# Patient Record
Sex: Male | Born: 1945 | Race: White | Hispanic: No | Marital: Married
Health system: Southern US, Community
[De-identification: ages and names within clinical notes are randomized; demographics above are authoritative.]

---

## 2012-02-15 ENCOUNTER — Emergency Department: Payer: Self-pay | Admitting: Emergency Medicine

## 2012-05-15 ENCOUNTER — Ambulatory Visit: Payer: Self-pay | Admitting: General Practice

## 2012-05-15 LAB — URINALYSIS, COMPLETE
Bacteria: NONE SEEN
Bilirubin,UR: NEGATIVE
Blood: NEGATIVE
Nitrite: NEGATIVE
Ph: 6 (ref 4.5–8.0)
Protein: NEGATIVE
Specific Gravity: 1.019 (ref 1.003–1.030)
WBC UR: <1 /HPF (ref 0–5)

## 2012-05-15 LAB — BASIC METABOLIC PANEL
Anion Gap: 7 (ref 7–16)
Chloride: 105 mmol/L (ref 98–107)
Co2: 28 mmol/L (ref 21–32)
Creatinine: 0.92 mg/dL (ref 0.60–1.30)
EGFR (Non-African Amer.): >60
Osmolality: 281 (ref 275–301)
Potassium: 4 mmol/L (ref 3.5–5.1)

## 2012-05-15 LAB — SEDIMENTATION RATE: Erythrocyte Sed Rate: 1 mm/hr (ref 0–20)

## 2012-05-15 LAB — CBC
Platelet: 159 10*3/uL (ref 150–440)
RDW: 13.3 % (ref 11.5–14.5)
WBC: 5.8 10*3/uL (ref 3.8–10.6)

## 2012-05-15 LAB — MRSA PCR SCREENING

## 2012-05-29 ENCOUNTER — Inpatient Hospital Stay: Payer: Self-pay | Admitting: General Practice

## 2012-05-30 LAB — BASIC METABOLIC PANEL
BUN: 11 mg/dL (ref 7–18)
Calcium, Total: 7.9 mg/dL — ABNORMAL LOW (ref 8.5–10.1)
Creatinine: 0.77 mg/dL (ref 0.60–1.30)
EGFR (African American): >60
EGFR (Non-African Amer.): >60
Glucose: 96 mg/dL (ref 65–99)
Potassium: 3.6 mmol/L (ref 3.5–5.1)
Sodium: 142 mmol/L (ref 136–145)

## 2012-05-31 LAB — BASIC METABOLIC PANEL
Calcium, Total: 7.6 mg/dL — ABNORMAL LOW (ref 8.5–10.1)
Creatinine: 0.69 mg/dL (ref 0.60–1.30)
EGFR (African American): >60
EGFR (Non-African Amer.): >60

## 2012-05-31 LAB — HEMOGLOBIN: HGB: 12.8 g/dL — ABNORMAL LOW (ref 13.0–18.0)

## 2013-12-20 IMAGING — CR DG KNEE 1-2V*L*
1 series · 2 of 2 positions shown · non-contrast
Comparison: none

REASON FOR EXAM: postop
COMMENTS:   Bedside (portable):Y

PROCEDURE:     DXR - DXR KNEE LEFT AP AND LATERAL  - May 29, 2012  [DATE]
RESULT:     The patient is status post left knee arthroplasty. Surgical
drains and skin staples are present. There is no immediate postoperative
bone or hardware complication. Atherosclerotic calcification is noted.

[Series 1: ap · 0.17mm/px · 2 of 2 slices shown]
[im 1/2]
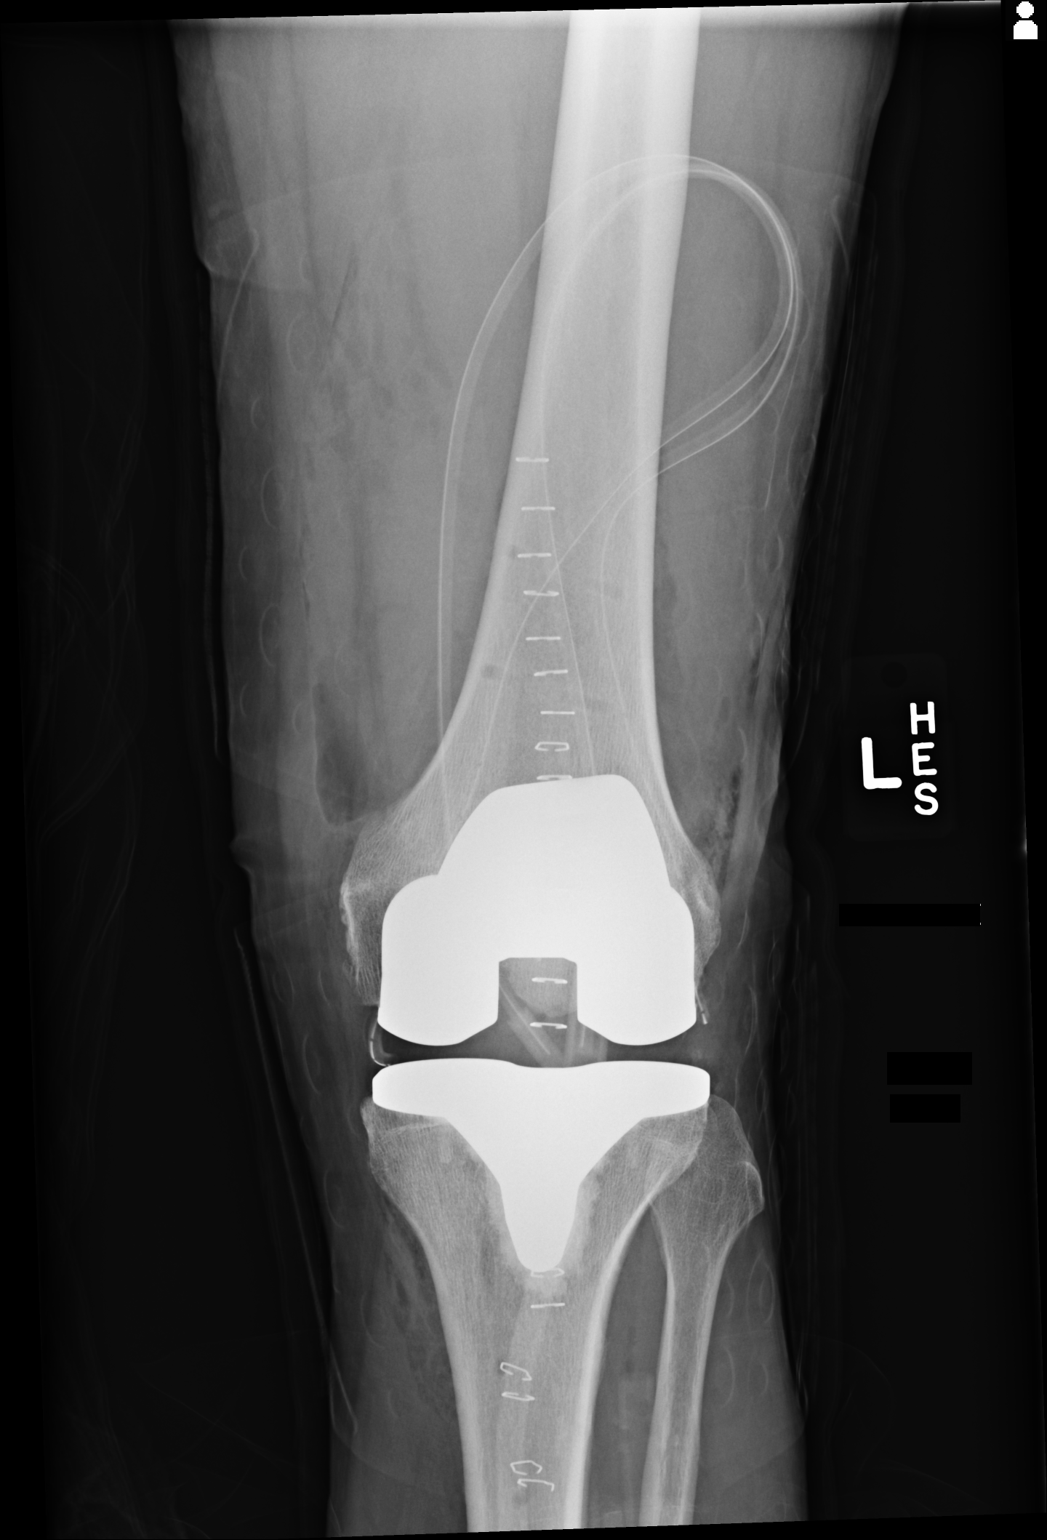
[im 2/2]
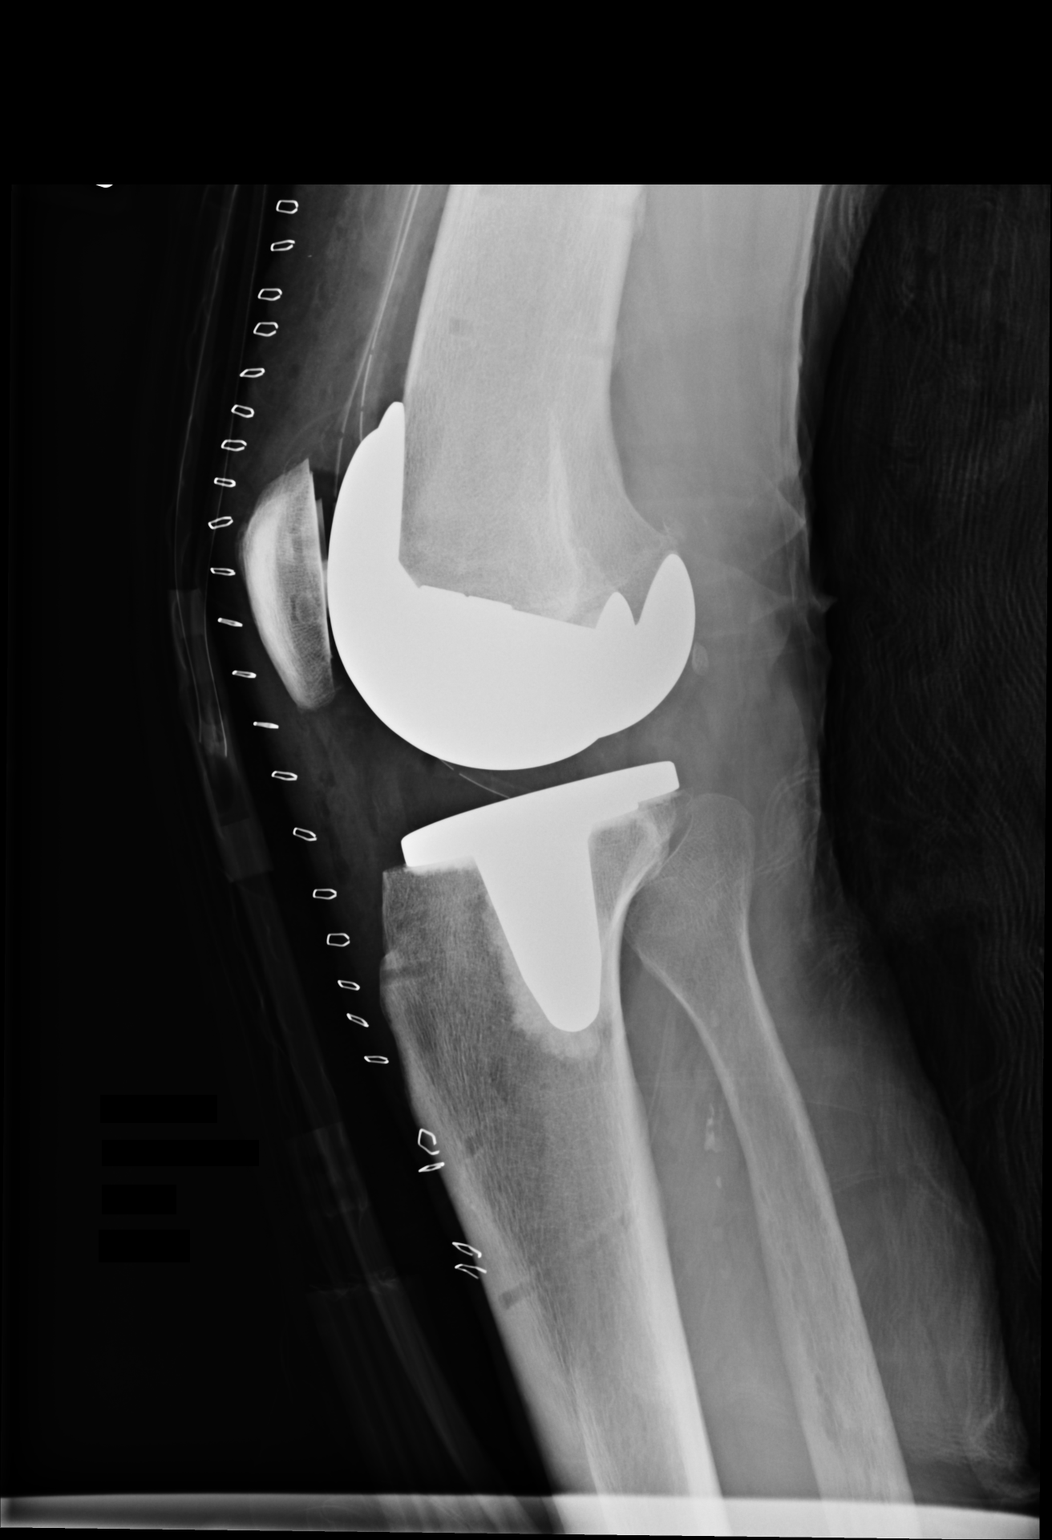

[2 of 2 positions shown; findings below may reference images not displayed]

IMPRESSION: Please see above.

[REDACTED]

## 2014-11-19 NOTE — Op Note (Signed)
PATIENT NAME:  Trevor Leonard, Trevor Leonard MR#:  [id number] DATE OF BIRTH:  [birth year]  DATE OF PROCEDURE:  05/29/2012  PREOPERATIVE DIAGNOSIS: Degenerative arthrosis of the left knee.   POSTOPERATIVE DIAGNOSIS: Degenerative arthrosis of the left knee.   PROCEDURE PERFORMED: Left total knee arthroplasty using computer-assisted navigation.   SURGEON: Francesco SorJames Hooten, M.Leonard.   ASSISTANT: Van ClinesJon Wolfe, PA-C (required to maintain retraction throughout the procedure)   ANESTHESIA: Femoral nerve block, spinal, and general.   ESTIMATED BLOOD LOSS: 50 mL.   FLUIDS REPLACED: 1600 mL of crystalloid.   TOURNIQUET TIME: 85 minutes.   DRAINS: Two medium drains to reinfusion system.   SOFT TISSUE RELEASES: Anterior cruciate ligament, posterior cruciate ligament, deep and superficial medial collateral ligament, and patellofemoral ligament.   IMPLANTS UTILIZED: DePuy PFC Sigma size 4 posterior stabilized femoral component (cemented), size 4 MBT tibial component (cemented), 38 mm three peg oval dome patella (cemented), and a 10 mm stabilized rotating platform polyethylene insert.   INDICATIONS FOR SURGERY: The patient is a 69 year old male who has been seen for complaints of progressive left knee pain. X-rays demonstrated severe degenerative changes in tricompartmental fashion with relative varus deformity. The patient had not seen any significant improvement despite conservative nonsurgical intervention. After discussion of the risks and benefits of surgical intervention, the patient expressed his understanding of the risks and benefits and agreed with plans for surgical intervention.   PROCEDURE IN DETAIL: The patient was brought into the Operating Room and, after adequate femoral nerve block and spinal anesthesia was achieved, a tourniquet was placed on the patient's upper left thigh. Due to some restlessness, general anesthesia was also instituted. The patient's left knee and leg were cleaned and prepped with alcohol  and DuraPrep and draped in the usual sterile fashion. A "time out" was performed as per usual protocol. The left lower extremity was exsanguinated using an Esmarch, and the tourniquet was inflated to 300 mmHg. An anterior longitudinal incision was made followed by a standard mid vastus approach. A large effusion was evacuated. The deep fibers of the medial collateral ligament were elevated in subperiosteal fashion off the medial flare of the tibia so as to maintain a continuous soft tissue sleeve. The patella was subluxed laterally and the patellofemoral ligament was incised. Inspection of the knee demonstrated severe degenerative changes with full thickness loss of articular cartilage to the medial tibial plateau and medial femoral condyle. Lesser but still severe changes were noted to the patellofemoral articulation as well as to the lateral compartment. Prominent osteophytes were debrided using rongeur. The anterior and posterior cruciate ligaments were excised. Two 4.0 mm Schanz pins were inserted into the femur and into the tibia for attachment of the array of trackers used for computer-assisted navigation. Hip center was identified using a circumduction technique. Distal landmarks were mapped using the computer. The distal femur and proximal tibia were mapped using the computer. Distal femoral cutting guide was positioned using computer-assisted navigation so as to achieve a 5 degree distal valgus cut. Cut was performed and verified using the computer. Distal femur was sized and it was felt that a size 4 femoral component was appropriate. A size 4 cutting guide was positioned and the anterior cut was performed and verified using the computer. This was followed by completion of the posterior and chamfer cuts. Femoral cutting guide for the central box was then positioned and the central box cut was performed.   Attention was then directed to the proximal tibia. Medial and lateral menisci were excised. The  extramedullary tibial cutting guide was positioned using computer-assisted navigation so as to achieve 0 degree varus valgus alignment and 0 degree posterior slope. Cut was performed and verified using the computer. The proximal tibia was sized and it was felt that a size 4 tibial tray was appropriate. Tibial and femoral trials were inserted followed by insertion of a 10 mm polyethylene insert. The knee was felt to be tight medially. A Cobb elevator was used to elevate the superficial fibers of the medial collateral ligament in a subperiosteal fashion. This allowed for excellent mediolateral soft tissue balancing with the knee in full extension and in flexion. Finally, the patella was cut and prepared so as to accommodate a 38 mm three peg oval dome patella. Patellar trial was placed and the knee was placed through a range of motion with excellent patellar tracking appreciated.   Prominent posterior osteophytes were debrided from the femoral condyles using curved osteotome and rongeur. Femoral trial was removed. Central post hole for the tibial component was reamed followed by insertion of a keel punch. Tibial tray was then removed. The cut surfaces of bone were irrigated with copious amounts of normal saline with antibiotic solution using pulsatile lavage and then suctioned dry. Polymethyl methacrylate cement was prepared in the usual fashion using a vacuum mixer. Cement was applied to the cut surface of the proximal tibia as well as along the undersurface of a size 4 MBT tibial component. The tibial component was positioned and impacted into place. Excess cement was removed using freer elevators. Cement was then applied to the cut surfaces of the femur as well as along the posterior flanges of a size 4 posterior stabilized femoral component. Femoral component was positioned and impacted into place. Excess cement was removed using freer elevators. A 10 mm polyethylene trial was inserted and the knee was brought  into full extension with steady axial compression applied. Finally, cement was applied to the backside of a 38 mm three peg oval dome patella and the patellar component was positioned and patellar clamp applied. Excess cement was removed using freer elevators.   After adequate curing of the cement, the tourniquet was deflated after total tourniquet time of 85 minutes. Hemostasis was achieved using electrocautery. The knee was irrigated with copious amounts of normal saline with antibiotic solution using pulsatile lavage and then suctioned dry. The knee was inspected for any residual cement debris. 30 mL of 0.25% Marcaine with epinephrine was injected along the posterior capsule. A 10 mm stabilized rotating platform polyethylene insert was inserted and the knee was placed through a range of motion. Excellent mediolateral soft tissue balancing was appreciated and excellent patellar tracking was noted.   Two medium drains were placed in the wound bed and brought out through a separate stab incision to be attached to a reinfusion system. The medial parapatellar portion of the incision was reapproximated using interrupted sutures of #1 Vicryl. The subcutaneous tissue was approximated in layers using first #0 Vicryl followed by #2-0 Vicryl. Skin was closed with skin staples. A sterile dressing was applied.   The patient tolerated the procedure well. He was transported to the recovery room in stable condition.  ____________________________ Illene Labrador. Angie Fava., MD [email or web address] Leonard: 05/29/2012 14:00:19 ET     T: 05/29/2012 15:11:30 ET        JOB#: [id number] cc: Fayrene Fearing P. Angie Fava., MD, <Dictator> JAMES P Angie Fava MD ELECTRONICALLY SIGNED 05/31/2012 6:24

## 2014-11-19 NOTE — Discharge Summary (Signed)
PATIENT NAME:  Trevor Leonard, Trevor Leonard MR#:  [id number] DATE OF BIRTH:  [birth year]  DATE OF ADMISSION:  05/29/2012 DATE OF DISCHARGE:  06/01/2012  ADMITTING DIAGNOSIS: Degenerative arthrosis of the left knee.   DISCHARGE DIAGNOSIS: Degenerative arthrosis of the left knee.   HISTORY: The patient is a 69 year old who has been followed at [facility] for discomfort to the left knee. He reported a long history of medial left knee pain. The patient had had flare up of lower extremity radicular symptoms in July and subsequently saw an increase in his left knee pain as well. At the time of surgery, he was not using any ambulatory aid. The patient had localized most of the pain along the medial aspect of the knee. His pain was aggravated with weight-bearing activities. He denied any gross locking of the knee but on occasion did have some near giving way. He had seen only modest improvement in his symptoms despite ibuprofen and occasional use of Percocet. The pain had increased to the point that it was significantly interfering with his activities of daily living. X-rays taken in the clinic showed narrowing of the medial cartilage space with associated varus alignment. He was noted to have osteophyte as well as subchondral sclerosis. After discussion of the risks and benefits of surgical intervention, the patient expressed his understanding of the risks and benefits and surgical plans for intervention.   PROCEDURE: Left total knee arthroplasty using computer-assisted navigation.   ANESTHESIA: Femoral nerve block, spinal and general.   SOFT TISSUE RELEASE: Anterior cruciate ligament, posterior cruciate ligament, and deep and superficial medial collateral ligaments, as well as the patellofemoral ligament.   IMPLANTS UTILIZED: DePuy PFC Sigma size 4 posterior stabilized femoral component (cemented), size 4 MBT tibial component (cemented), 38 mm three pegged oval dome patella (cemented), and a 10 mm  stabilized rotating platform polyethylene insert.   HOSPITAL COURSE: The patient tolerated the procedure very well. He had no complications. He was then taken to PAC-U where he was stabilized and then transferred to the orthopedic floor. The patient began receiving anticoagulation therapy of Lovenox 30 mg subcutaneous every 12 hours per anesthesia and pharmacy protocol. He was fitted with TED stockings bilaterally. These were allowed to be removed one hour per eight hour shift. The patient was also fitted with the AV-I compression foot pumps bilaterally set at 80 mmHg. His calves have been nontender and free of any evidence of any deep venous thromboses. Heels were elevated off the bed using rolled towels.   The patient has denied any chest pain or shortness of breath. Vital signs have been stable. Hemodynamically he was stable and no transfusions were given other than the Autovac transfusion given the first six hours postoperatively.   Physical therapy was initiated on day one for gait training and transfers. Upon being discharged he was ambulating greater than 200 feet. She was able go up and down four sets of steps. He was independent with bed to chair transfers. Occupational therapy was also initiated on day one for activities of daily living and assistive devices.   The patient's IV, Foley and Hemovac were discontinued on day two along with a dressing change. The wound was free of any drainage or signs of infection. Polar Care was reapplied to the surgical leg maintaining a temperature of 40 to 50 degrees Fahrenheit.   DISPOSITION: The patient is being discharged to home in improved stable condition.   DISCHARGE INSTRUCTIONS: He is to weight bear as tolerated. Continue using a  walker until cleared by physical therapy to go to a quad cane. He will receive home health physical therapy for two weeks. He is to continue with his TED stockings. These are to be worn during the day but may be removed at  night. He is to continue using the Polar Care as much as possible around-the-clock until seen in the office. He is placed on a regular diet. He is to resume his regular medications that he was on prior to admission. He was given a prescription for oxycodone 5 mg 1 to 2 tablets every 4 to 6 hours p.r.n. for pain and Ultram 50 mg 1 to 2 tablets every 4 to 6 hours p.r.n. for pain. A prescription for Lovenox 40 mg subcutaneously daily for 14 days then discontinue and begin taking one 81 mg enteric-coated aspirin per day. He has a follow-up           appointment in the clinic in two weeks. He is to call the clinic sooner if any temperatures of 101.5 or greater or excessive bleeding.   PAST MEDICAL HISTORY: Unremarkable  ____________________________ Van ClinesJon Wolfe, PA [email or web address] D: 06/01/2012 07:19:31 ET T: 06/01/2012 09:50:18 ET JOB#: [id number]  cc: Van ClinesJon Wolfe, PA, <Dictator> JON WOLFE PA ELECTRONICALLY SIGNED 06/06/2012 7:38
